# Patient Record
Sex: Female | Born: 1960 | Race: Black or African American | Hispanic: No | Marital: Single
Health system: Southern US, Community
[De-identification: ages and names within clinical notes are randomized; demographics above are authoritative.]

---

## 2017-03-24 ENCOUNTER — Inpatient Hospital Stay
Admission: AD | Admit: 2017-03-24 | Discharge: 2017-04-22 | Disposition: A | Discharge status: Skilled Nursing Facility | Payer: Medicaid Other | Source: Ambulatory Visit | Attending: Internal Medicine | Admitting: Internal Medicine

## 2017-03-24 ENCOUNTER — Other Ambulatory Visit (HOSPITAL_COMMUNITY): Payer: Medicaid Other

## 2017-03-24 DIAGNOSIS — Z4659 Encounter for fitting and adjustment of other gastrointestinal appliance and device: Secondary | ICD-10-CM

## 2017-03-24 DIAGNOSIS — J189 Pneumonia, unspecified organism: Secondary | ICD-10-CM

## 2017-03-24 DIAGNOSIS — J9690 Respiratory failure, unspecified, unspecified whether with hypoxia or hypercapnia: Secondary | ICD-10-CM

## 2017-03-24 DIAGNOSIS — Z431 Encounter for attention to gastrostomy: Secondary | ICD-10-CM

## 2017-03-25 ENCOUNTER — Other Ambulatory Visit (HOSPITAL_COMMUNITY): Payer: Medicaid Other

## 2017-03-25 LAB — HEMOGLOBIN A1C
HEMOGLOBIN A1C: 6.4 % — AB (ref 4.8–5.6)
Mean Plasma Glucose: 136.98 mg/dL

## 2017-03-25 LAB — CBC WITH DIFFERENTIAL/PLATELET
BASOS ABS: 0 10*3/uL (ref 0.0–0.1)
Basophils Relative: 0 %
Eosinophils Absolute: 0.2 10*3/uL (ref 0.0–0.7)
Eosinophils Relative: 2 %
HCT: 32.8 % — ABNORMAL LOW (ref 36.0–46.0)
Hemoglobin: 10.3 g/dL — ABNORMAL LOW (ref 12.0–15.0)
LYMPHS ABS: 1.6 10*3/uL (ref 0.7–4.0)
LYMPHS PCT: 17 %
MCH: 28.6 pg (ref 26.0–34.0)
MCHC: 31.4 g/dL (ref 30.0–36.0)
MCV: 91.1 fL (ref 78.0–100.0)
MONO ABS: 0.9 10*3/uL (ref 0.1–1.0)
Monocytes Relative: 9 %
NEUTROS ABS: 6.9 10*3/uL (ref 1.7–7.7)
Neutrophils Relative %: 72 %
PLATELETS: 290 10*3/uL (ref 150–400)
RBC: 3.6 MIL/uL — ABNORMAL LOW (ref 3.87–5.11)
RDW: 15.5 % (ref 11.5–15.5)
WBC: 9.6 10*3/uL (ref 4.0–10.5)

## 2017-03-25 LAB — COMPREHENSIVE METABOLIC PANEL
ALK PHOS: 44 U/L (ref 38–126)
ALT: 45 U/L (ref 14–54)
ANION GAP: 8 (ref 5–15)
AST: 24 U/L (ref 15–41)
Albumin: 2.5 g/dL — ABNORMAL LOW (ref 3.5–5.0)
BILIRUBIN TOTAL: 0.6 mg/dL (ref 0.3–1.2)
BUN: 7 mg/dL (ref 6–20)
CALCIUM: 9.1 mg/dL (ref 8.9–10.3)
CO2: 26 mmol/L (ref 22–32)
Chloride: 101 mmol/L (ref 101–111)
Creatinine, Ser: 0.57 mg/dL (ref 0.44–1.00)
GFR calc non Af Amer: >60 mL/min (ref >60)
Glucose, Bld: 158 mg/dL — ABNORMAL HIGH (ref 65–99)
Potassium: 4 mmol/L (ref 3.5–5.1)
SODIUM: 135 mmol/L (ref 135–145)
TOTAL PROTEIN: 6.3 g/dL — AB (ref 6.5–8.1)

## 2017-03-25 LAB — URINALYSIS, ROUTINE W REFLEX MICROSCOPIC
Bilirubin Urine: NEGATIVE
Glucose, UA: NEGATIVE mg/dL
Ketones, ur: NEGATIVE mg/dL
Nitrite: NEGATIVE
PH: 6 (ref 5.0–8.0)
Protein, ur: NEGATIVE mg/dL
SPECIFIC GRAVITY, URINE: 1.015 (ref 1.005–1.030)

## 2017-03-25 LAB — TSH: TSH: 3.604 u[IU]/mL (ref 0.350–4.500)

## 2017-03-25 LAB — PROTIME-INR
INR: 1.17
Prothrombin Time: 14.8 seconds (ref 11.4–15.2)

## 2017-03-25 LAB — PHOSPHORUS: PHOSPHORUS: 2.8 mg/dL (ref 2.5–4.6)

## 2017-03-25 LAB — MAGNESIUM: MAGNESIUM: 1.6 mg/dL — AB (ref 1.7–2.4)

## 2017-03-26 LAB — CBC
HEMATOCRIT: 34.8 % — AB (ref 36.0–46.0)
Hemoglobin: 11.3 g/dL — ABNORMAL LOW (ref 12.0–15.0)
MCH: 29.6 pg (ref 26.0–34.0)
MCHC: 32.5 g/dL (ref 30.0–36.0)
MCV: 91.1 fL (ref 78.0–100.0)
PLATELETS: 333 10*3/uL (ref 150–400)
RBC: 3.82 MIL/uL — ABNORMAL LOW (ref 3.87–5.11)
RDW: 15.4 % (ref 11.5–15.5)
WBC: 11.3 10*3/uL — ABNORMAL HIGH (ref 4.0–10.5)

## 2017-03-26 LAB — BASIC METABOLIC PANEL
Anion gap: 9 (ref 5–15)
BUN: 8 mg/dL (ref 6–20)
CO2: 27 mmol/L (ref 22–32)
CREATININE: 0.67 mg/dL (ref 0.44–1.00)
Calcium: 9.4 mg/dL (ref 8.9–10.3)
Chloride: 96 mmol/L — ABNORMAL LOW (ref 101–111)
GFR calc Af Amer: >60 mL/min (ref >60)
GLUCOSE: 301 mg/dL — AB (ref 65–99)
Potassium: 4.1 mmol/L (ref 3.5–5.1)
Sodium: 132 mmol/L — ABNORMAL LOW (ref 135–145)

## 2017-03-26 LAB — URINE CULTURE: Culture: NO GROWTH

## 2017-03-27 LAB — CBC
HCT: 34.5 % — ABNORMAL LOW (ref 36.0–46.0)
HEMOGLOBIN: 11.2 g/dL — AB (ref 12.0–15.0)
MCH: 29.7 pg (ref 26.0–34.0)
MCHC: 32.5 g/dL (ref 30.0–36.0)
MCV: 91.5 fL (ref 78.0–100.0)
PLATELETS: 329 10*3/uL (ref 150–400)
RBC: 3.77 MIL/uL — AB (ref 3.87–5.11)
RDW: 15.3 % (ref 11.5–15.5)
WBC: 13.6 10*3/uL — ABNORMAL HIGH (ref 4.0–10.5)

## 2017-03-27 LAB — OCCULT BLOOD X 1 CARD TO LAB, STOOL: FECAL OCCULT BLD: POSITIVE — AB

## 2017-03-28 LAB — BASIC METABOLIC PANEL
ANION GAP: 7 (ref 5–15)
BUN: 14 mg/dL (ref 6–20)
CHLORIDE: 100 mmol/L — AB (ref 101–111)
CO2: 27 mmol/L (ref 22–32)
Calcium: 9.3 mg/dL (ref 8.9–10.3)
Creatinine, Ser: 0.6 mg/dL (ref 0.44–1.00)
GFR calc Af Amer: >60 mL/min (ref >60)
GLUCOSE: 297 mg/dL — AB (ref 65–99)
POTASSIUM: 4.2 mmol/L (ref 3.5–5.1)
Sodium: 134 mmol/L — ABNORMAL LOW (ref 135–145)

## 2017-03-28 LAB — CBC
HEMATOCRIT: 32.4 % — AB (ref 36.0–46.0)
HEMOGLOBIN: 10.4 g/dL — AB (ref 12.0–15.0)
MCH: 29.2 pg (ref 26.0–34.0)
MCHC: 32.1 g/dL (ref 30.0–36.0)
MCV: 91 fL (ref 78.0–100.0)
PLATELETS: 317 10*3/uL (ref 150–400)
RBC: 3.56 MIL/uL — AB (ref 3.87–5.11)
RDW: 15.1 % (ref 11.5–15.5)
WBC: 12.5 10*3/uL — AB (ref 4.0–10.5)

## 2017-03-28 LAB — MAGNESIUM: MAGNESIUM: 1.7 mg/dL (ref 1.7–2.4)

## 2017-03-28 LAB — PHOSPHORUS: Phosphorus: 3 mg/dL (ref 2.5–4.6)

## 2017-03-29 LAB — CBC
HCT: 33.4 % — ABNORMAL LOW (ref 36.0–46.0)
Hemoglobin: 10.8 g/dL — ABNORMAL LOW (ref 12.0–15.0)
MCH: 29.6 pg (ref 26.0–34.0)
MCHC: 32.3 g/dL (ref 30.0–36.0)
MCV: 91.5 fL (ref 78.0–100.0)
PLATELETS: 328 10*3/uL (ref 150–400)
RBC: 3.65 MIL/uL — AB (ref 3.87–5.11)
RDW: 15.2 % (ref 11.5–15.5)
WBC: 10.6 10*3/uL — ABNORMAL HIGH (ref 4.0–10.5)

## 2017-03-29 LAB — CULTURE, RESPIRATORY: CULTURE: NORMAL

## 2017-03-29 LAB — CULTURE, RESPIRATORY W GRAM STAIN

## 2017-03-31 LAB — BASIC METABOLIC PANEL
Anion gap: 11 (ref 5–15)
BUN: 19 mg/dL (ref 6–20)
CHLORIDE: 99 mmol/L — AB (ref 101–111)
CO2: 25 mmol/L (ref 22–32)
CREATININE: 0.71 mg/dL (ref 0.44–1.00)
Calcium: 9.3 mg/dL (ref 8.9–10.3)
GFR calc non Af Amer: >60 mL/min (ref >60)
GLUCOSE: 260 mg/dL — AB (ref 65–99)
Potassium: 4.2 mmol/L (ref 3.5–5.1)
Sodium: 135 mmol/L (ref 135–145)

## 2017-03-31 LAB — CBC
HCT: 38.5 % (ref 36.0–46.0)
HEMOGLOBIN: 12.1 g/dL (ref 12.0–15.0)
MCH: 29.1 pg (ref 26.0–34.0)
MCHC: 31.4 g/dL (ref 30.0–36.0)
MCV: 92.5 fL (ref 78.0–100.0)
Platelets: 260 10*3/uL (ref 150–400)
RBC: 4.16 MIL/uL (ref 3.87–5.11)
RDW: 15.6 % — ABNORMAL HIGH (ref 11.5–15.5)
WBC: 8.9 10*3/uL (ref 4.0–10.5)

## 2017-04-02 LAB — BASIC METABOLIC PANEL
ANION GAP: 6 (ref 5–15)
BUN: 13 mg/dL (ref 6–20)
CO2: 27 mmol/L (ref 22–32)
Calcium: 9.2 mg/dL (ref 8.9–10.3)
Chloride: 104 mmol/L (ref 101–111)
Creatinine, Ser: 0.7 mg/dL (ref 0.44–1.00)
GLUCOSE: 275 mg/dL — AB (ref 65–99)
POTASSIUM: 3.9 mmol/L (ref 3.5–5.1)
Sodium: 137 mmol/L (ref 135–145)

## 2017-04-02 LAB — CBC
HEMATOCRIT: 33.3 % — AB (ref 36.0–46.0)
Hemoglobin: 10.6 g/dL — ABNORMAL LOW (ref 12.0–15.0)
MCH: 29.2 pg (ref 26.0–34.0)
MCHC: 31.8 g/dL (ref 30.0–36.0)
MCV: 91.7 fL (ref 78.0–100.0)
PLATELETS: 357 10*3/uL (ref 150–400)
RBC: 3.63 MIL/uL — AB (ref 3.87–5.11)
RDW: 15.5 % (ref 11.5–15.5)
WBC: 8.2 10*3/uL (ref 4.0–10.5)

## 2017-04-03 LAB — CULTURE, RESPIRATORY

## 2017-04-03 LAB — CULTURE, RESPIRATORY W GRAM STAIN

## 2017-04-05 ENCOUNTER — Other Ambulatory Visit (HOSPITAL_COMMUNITY): Payer: Medicaid Other

## 2017-04-05 LAB — BLOOD GAS, ARTERIAL
Acid-Base Excess: 5.2 mmol/L — ABNORMAL HIGH (ref 0.0–2.0)
BICARBONATE: 28.8 mmol/L — AB (ref 20.0–28.0)
DRAWN BY: 244851
FIO2: 70
MECHVT: 430 mL
O2 Saturation: 93 %
PEEP: 5 cmH2O
PO2 ART: 66.2 mmHg — AB (ref 83.0–108.0)
Patient temperature: 98.6
RATE: 14 resp/min
pCO2 arterial: 39.6 mmHg (ref 32.0–48.0)
pH, Arterial: 7.475 — ABNORMAL HIGH (ref 7.350–7.450)

## 2017-04-06 LAB — BASIC METABOLIC PANEL
Anion gap: 7 (ref 5–15)
BUN: 15 mg/dL (ref 6–20)
CALCIUM: 9.3 mg/dL (ref 8.9–10.3)
CHLORIDE: 100 mmol/L — AB (ref 101–111)
CO2: 30 mmol/L (ref 22–32)
Creatinine, Ser: 0.71 mg/dL (ref 0.44–1.00)
GFR calc Af Amer: >60 mL/min (ref >60)
GFR calc non Af Amer: >60 mL/min (ref >60)
Glucose, Bld: 248 mg/dL — ABNORMAL HIGH (ref 65–99)
Potassium: 3.9 mmol/L (ref 3.5–5.1)
SODIUM: 137 mmol/L (ref 135–145)

## 2017-04-06 LAB — CBC WITH DIFFERENTIAL/PLATELET
Basophils Absolute: 0 10*3/uL (ref 0.0–0.1)
Basophils Relative: 0 %
EOS ABS: 0.2 10*3/uL (ref 0.0–0.7)
Eosinophils Relative: 3 %
HCT: 34.5 % — ABNORMAL LOW (ref 36.0–46.0)
HEMOGLOBIN: 11 g/dL — AB (ref 12.0–15.0)
LYMPHS ABS: 1.9 10*3/uL (ref 0.7–4.0)
Lymphocytes Relative: 23 %
MCH: 29.1 pg (ref 26.0–34.0)
MCHC: 31.9 g/dL (ref 30.0–36.0)
MCV: 91.3 fL (ref 78.0–100.0)
MONO ABS: 1 10*3/uL (ref 0.1–1.0)
MONOS PCT: 13 %
Neutro Abs: 5.1 10*3/uL (ref 1.7–7.7)
Neutrophils Relative %: 61 %
PLATELETS: 319 10*3/uL (ref 150–400)
RBC: 3.78 MIL/uL — ABNORMAL LOW (ref 3.87–5.11)
RDW: 15.7 % — AB (ref 11.5–15.5)
WBC: 8.2 10*3/uL (ref 4.0–10.5)

## 2017-04-06 LAB — PHOSPHORUS: Phosphorus: 3.5 mg/dL (ref 2.5–4.6)

## 2017-04-06 LAB — HEMOGLOBIN A1C
Hgb A1c MFr Bld: 7.4 % — ABNORMAL HIGH (ref 4.8–5.6)
Mean Plasma Glucose: 165.68 mg/dL

## 2017-04-06 LAB — MAGNESIUM: MAGNESIUM: 1.7 mg/dL (ref 1.7–2.4)

## 2017-04-06 NOTE — Consult Note (Signed)
Chief Complaint: Patient was seen in consultation today for dysphagia  Referring Physician(s):  Dr. Sharyon Medicus  History of Present Illness: Nancy Rogers is a 56 y.o. female with past medical history of respiratory failure requiring vent-trach dependence, now with ongoing difficulty in meeting nutrition needs.  Patient currently with NGT in place.   CT Abd obtained 04/05/17 shows: Stomach anatomy is amenable to fluoroscopic gastrostomy insertion. Stomach is anteriorly positioned and superior to the transverse colon. No associated hiatal hernia. Negative for bowel obstruction, significant ileus, fluid collection or abscess Abdominal atherosclerosis Bibasilar atelectasis / consolidation worse on the right  No past medical history on file.  No past surgical history on file.  Allergies: Patient has no allergy information on record.  Medications: Prior to Admission medications   Not on File     No family history on file.  Social History   Social History  . Marital status: Single    Spouse name: N/A  . Number of children: N/A  . Years of education: N/A   Social History Main Topics  . Smoking status: Not on file  . Smokeless tobacco: Not on file  . Alcohol use Not on file  . Drug use: Unknown  . Sexual activity: Not on file   Other Topics Concern  . Not on file   Social History Narrative  . No narrative on file    Review of Systems  Unable to perform ROS: Intubated    Vital Signs: There were no vitals taken for this visit.  Physical Exam  Constitutional: She appears well-developed.  Cardiovascular: Normal rate, regular rhythm and normal heart sounds.   Pulmonary/Chest: Effort normal.  Trach in place, coarse breath sounds  Abdominal: Soft.  Skin: Skin is warm and dry.  Nursing note reviewed.   Mallampati Score:  MD Evaluation Airway: Other (comments) Airway comments: trach vent Heart: WNL Abdomen: WNL Chest/ Lungs: WNL ASA  Classification:  3 Mallampati/Airway Score: Three  Imaging: Ct Abdomen Wo Contrast  Result Date: 04/05/2017 CLINICAL DATA:  malnutrition, assess for gastrostomy placement EXAM: CT ABDOMEN WITHOUT CONTRAST TECHNIQUE: Multidetector CT imaging of the abdomen was performed following the standard protocol without IV contrast. COMPARISON:  None available FINDINGS: Lower chest: Mild bibasilar partial collapse / consolidation worse on the right. No pericardial pleural effusion. Normal heart size. Hepatobiliary: No focal liver abnormality is seen. No gallstones, gallbladder wall thickening, or biliary dilatation. Pancreas: Unremarkable. No pancreatic ductal dilatation or surrounding inflammatory changes. Spleen: Normal in size without focal abnormality. Adrenals/Urinary Tract: Adrenal glands are unremarkable. Kidneys are normal, without renal calculi, focal lesion, or hydronephrosis. Stomach/Bowel: No significant hiatal hernia. Stomach is normally positioned and amenable to fluoroscopic gastrostomy insertion. NG tube tip extends to the distal stomach within the duodenal bulb. Transverse colon is inferior to the stomach. Negative for bowel obstruction, significant dilatation, ileus, or free air. No fluid collection or abscess. Vascular/Lymphatic: Atherosclerosis of the aorta. Negative for aneurysm. No retroperitoneal abnormality or hemorrhage. No adenopathy. Other: Image portion of the uterus demonstrates mild enlargement with degenerated calcified fibroids. This extends into the lower abdomen. Pelvis was not imaged. Musculoskeletal: Degenerative changes noted spine. No acute osseous finding or compression fracture. IMPRESSION: Stomach anatomy is amenable to fluoroscopic gastrostomy insertion. Stomach is anteriorly positioned and superior to the transverse colon. No associated hiatal hernia. Negative for bowel obstruction, significant ileus, fluid collection or abscess Abdominal atherosclerosis Bibasilar atelectasis / consolidation  worse on the right Electronically Signed   By: Judie Petit.  Shick M.D.  On: 04/05/2017 20:00   Dg Chest Port 1 View  Result Date: 04/05/2017 CLINICAL DATA:  Respiratory failure EXAM: PORTABLE CHEST 1 VIEW COMPARISON:  January 21, 2017 FINDINGS: Tracheostomy catheter tip is 3.6 cm above the carina. Nasogastric tube tip and side port are below the diaphragm. No pneumothorax. There is atelectatic change in the medial left base. Lungs elsewhere clear. Heart size and pulmonary vascularity are normal. No adenopathy. No evident bone lesions. IMPRESSION: Tube positions as described without pneumothorax. Atelectasis medial left base. Lungs elsewhere clear. Stable cardiac silhouette. Electronically Signed   By: Bretta Bang III M.D.   On: 04/05/2017 08:33   Dg Chest Port 1 View  Result Date: 03/24/2017 CLINICAL DATA:  Respiratory failure. Patient has undergone tracheostomy tube placement and nasogastric tube placement. Assess positioning. EXAM: PORTABLE CHEST 1 VIEW COMPARISON:  None in PACs FINDINGS: The lung volumes are low. There is is increased density in the medial retrocardiac region bilaterally. The tracheostomy tube tip projects approximately 1 cm above the clavicular heads. The esophagogastric tube tip and proximal port lie in the mid to distal gastric body. The heart is top-normal in size. The central pulmonary vascularity is prominent but some crowding due to hypoinflation is suspected. There The bony structures exhibit no acute abnormality. IMPRESSION: Bilateral hypoinflation. Probable bibasilar atelectasis or pneumonia. Mild enlargement of cardiac silhouette without significant pulmonary vascular congestion. Positioning of the esophagogastric tube and the tracheostomy tube is within the limits of normal. Electronically Signed   By: David  [place] M.D.   On: 03/24/2017 16:23   Dg Abd Portable 1v  Result Date: 03/25/2017 CLINICAL DATA:  Encounter for NG tube placement. EXAM: PORTABLE ABDOMEN - 1 VIEW COMPARISON:   None. FINDINGS: Nasogastric tube appears well positioned in the stomach with tip directed towards the stomach pylorus/duodenal bulb. Visualized bowel gas pattern is nonobstructive. No evidence of free intraperitoneal air seen. IMPRESSION: Nasogastric tube now appears well positioned in the stomach with tip directed towards the stomach pylorus/duodenal bulb. Electronically Signed   By: Bary Richard M.D.   On: 03/25/2017 18:34   Dg Abd Portable 1v  Result Date: 03/25/2017 CLINICAL DATA:  NG tube placement. EXAM: PORTABLE ABDOMEN - 1 VIEW COMPARISON:  None. FINDINGS: Enteric tube is not visible on this plain film of the abdomen. Visualized bowel gas pattern is nonobstructive. No evidence of soft tissue mass or abnormal fluid collection. IMPRESSION: Nasogastric tube is not visible on this exam. Electronically Signed   By: Bary Richard M.D.   On: 03/25/2017 17:59   Dg Abd Portable 1v  Result Date: 03/24/2017 CLINICAL DATA:  NG placement EXAM: PORTABLE ABDOMEN - 1 VIEW COMPARISON:  None. FINDINGS: Enteric tube tip is localized to the right upper quadrant consistent with location in the distal stomach. Residual contrast material throughout the colon. No small or large bowel distention. Calcification in the pelvis likely represents a calcified fibroid. IMPRESSION: Enteric tube tip is in the right upper quadrant consistent with location in the distal stomach. Electronically Signed   By: Burman Nieves M.D.   On: 03/24/2017 23:27    Labs:  CBC:  Recent Labs  03/29/17 1050 03/31/17 0622 04/02/17 0735 04/06/17 0606  WBC 10.6* 8.9 8.2 8.2  HGB 10.8* 12.1 10.6* 11.0*  HCT 33.4* 38.5 33.3* 34.5*  PLT 328 260 357 319    COAGS:  Recent Labs  03/25/17 0836  INR 1.17    BMP:  Recent Labs  03/28/17 0449 03/31/17 0622 04/02/17 0735 04/06/17 0606  NA 134*  135 137 137  K 4.2 4.2 3.9 3.9  CL 100* 99* 104 100*  CO2 GLUCOSE 297* 260* 275* 248*  BUN CALCIUM 9.3 9.3  9.2 9.3  CREATININE 0.60 0.71 0.70 0.71  GFRNONAA >60 >60 >60 >60  GFRAA >60 >60 >60 >60    LIVER FUNCTION TESTS:  Recent Labs  03/25/17 0836  BILITOT 0.6  AST 24  ALT 45  ALKPHOS 44  PROT 6.3*  ALBUMIN 2.5*    TUMOR MARKERS: No results for input(s): AFPTM, CEA, CA199, CHROMGRNA in the last 8760 hours.  Assessment and Plan: Patient with vent-dependent respiratory failure requiring long-term care.  Request is made for gastrostomy placement by Dr. Sharyon Medicus.  Anatomy reviewed by Dr. Miles Costain who approves patient for procedure in IR.  Patient currently with NGT in place.  Orders placed for hold TF after midnight as well as blood thinners for possible placement.  Discussed procedure with mother.  Risks and benefits discussed with the patient including, but not limited to the need for a barium enema during the procedure, bleeding, infection, peritonitis, or damage to adjacent structures. All of the patient's questions were answered, patient is agreeable to proceed. Consent signed and in chart.  Thank you for this interesting consult.  I greatly enjoyed meeting Alaina R Majerus and look forward to participating in their care.  A copy of this report was sent to the requesting provider on this date.  Electronically Signed: Hoyt Koch 04/06/2017, 3:57 PM   I spent a total of 40 Minutes    in face to face in clinical consultation, greater than 50% of which was counseling/coordinating care for long-term care.

## 2017-04-08 ENCOUNTER — Encounter (HOSPITAL_COMMUNITY): Payer: Self-pay | Admitting: Interventional Radiology

## 2017-04-08 ENCOUNTER — Other Ambulatory Visit (HOSPITAL_COMMUNITY): Payer: Medicaid Other

## 2017-04-08 HISTORY — PX: IR GASTROSTOMY TUBE MOD SED: IMG625

## 2017-04-08 MED ORDER — MIDAZOLAM HCL 2 MG/2ML IJ SOLN
INTRAMUSCULAR | Status: AC | PRN
  Administered 2017-04-08 (×2): 1 mg via INTRAVENOUS

## 2017-04-08 MED ORDER — LIDOCAINE HCL (PF) 1 % IJ SOLN
INTRAMUSCULAR | Status: AC
  Filled 2017-04-08: qty 30

## 2017-04-08 MED ORDER — VANCOMYCIN HCL IN DEXTROSE 1-5 GM/200ML-% IV SOLN
1000.0000 mg | Freq: Once | INTRAVENOUS | Status: AC
Start: 2017-04-08 — End: 2017-04-08
  Administered 2017-04-08: 1000 mg via INTRAVENOUS

## 2017-04-08 MED ORDER — MIDAZOLAM HCL 2 MG/2ML IJ SOLN
INTRAMUSCULAR | Status: AC
  Filled 2017-04-08: qty 4

## 2017-04-08 MED ORDER — FENTANYL CITRATE (PF) 100 MCG/2ML IJ SOLN
INTRAMUSCULAR | Status: AC | PRN
  Administered 2017-04-08: 25 ug via INTRAVENOUS

## 2017-04-08 MED ORDER — VANCOMYCIN HCL IN DEXTROSE 1-5 GM/200ML-% IV SOLN
INTRAVENOUS | Status: AC
  Filled 2017-04-08: qty 200

## 2017-04-08 MED ORDER — FENTANYL CITRATE (PF) 100 MCG/2ML IJ SOLN
INTRAMUSCULAR | Status: AC
  Filled 2017-04-08: qty 4

## 2017-04-08 MED ORDER — IOPAMIDOL (ISOVUE-300) INJECTION 61%
INTRAVENOUS | Status: AC
  Administered 2017-04-08: 10 mL
  Filled 2017-04-08: qty 50

## 2017-04-08 MED ORDER — LIDOCAINE HCL (PF) 1 % IJ SOLN
INTRAMUSCULAR | Status: AC | PRN
  Administered 2017-04-08 (×2): 5 mL

## 2017-04-08 NOTE — Procedures (Signed)
Interventional Radiology Procedure Note  Procedure: Percutaneous gastrostomy tube placement  Complications: None  Estimated Blood Loss: < 10 mL  20 Fr bumper retention gastrostomy tube placed with tip in body of stomach.  OK to use in 24 hours.  Jodi Marble. Fredia Sorrow, M.D Pager:  [phone]

## 2017-04-09 LAB — CBC
HEMATOCRIT: 26.6 % — AB (ref 36.0–46.0)
HEMOGLOBIN: 8.6 g/dL — AB (ref 12.0–15.0)
MCH: 29.2 pg (ref 26.0–34.0)
MCHC: 32.3 g/dL (ref 30.0–36.0)
MCV: 90.2 fL (ref 78.0–100.0)
Platelets: 266 10*3/uL (ref 150–400)
RBC: 2.95 MIL/uL — ABNORMAL LOW (ref 3.87–5.11)
RDW: 15.2 % (ref 11.5–15.5)
WBC: 12.1 10*3/uL — AB (ref 4.0–10.5)

## 2017-04-09 LAB — BASIC METABOLIC PANEL
ANION GAP: 7 (ref 5–15)
BUN: 13 mg/dL (ref 6–20)
CO2: 27 mmol/L (ref 22–32)
CREATININE: 0.73 mg/dL (ref 0.44–1.00)
Calcium: 8.6 mg/dL — ABNORMAL LOW (ref 8.9–10.3)
Chloride: 96 mmol/L — ABNORMAL LOW (ref 101–111)
Glucose, Bld: 231 mg/dL — ABNORMAL HIGH (ref 65–99)
Potassium: 3.5 mmol/L (ref 3.5–5.1)
SODIUM: 130 mmol/L — AB (ref 135–145)

## 2017-04-10 LAB — CULTURE, RESPIRATORY

## 2017-04-10 LAB — CULTURE, RESPIRATORY W GRAM STAIN

## 2017-04-13 LAB — CBC
HCT: 32.7 % — ABNORMAL LOW (ref 36.0–46.0)
HEMOGLOBIN: 10.4 g/dL — AB (ref 12.0–15.0)
MCH: 28.4 pg (ref 26.0–34.0)
MCHC: 31.8 g/dL (ref 30.0–36.0)
MCV: 89.3 fL (ref 78.0–100.0)
PLATELETS: 294 10*3/uL (ref 150–400)
RBC: 3.66 MIL/uL — AB (ref 3.87–5.11)
RDW: 15.3 % (ref 11.5–15.5)
WBC: 9.6 10*3/uL (ref 4.0–10.5)

## 2017-04-13 LAB — BASIC METABOLIC PANEL
ANION GAP: 8 (ref 5–15)
BUN: 8 mg/dL (ref 6–20)
CHLORIDE: 98 mmol/L — AB (ref 101–111)
CO2: 27 mmol/L (ref 22–32)
Calcium: 9.1 mg/dL (ref 8.9–10.3)
Creatinine, Ser: 0.57 mg/dL (ref 0.44–1.00)
Glucose, Bld: 221 mg/dL — ABNORMAL HIGH (ref 65–99)
POTASSIUM: 3.7 mmol/L (ref 3.5–5.1)
SODIUM: 133 mmol/L — AB (ref 135–145)

## 2017-04-13 LAB — BLOOD GAS, ARTERIAL
ACID-BASE EXCESS: 4.9 mmol/L — AB (ref 0.0–2.0)
Bicarbonate: 28.3 mmol/L — ABNORMAL HIGH (ref 20.0–28.0)
FIO2: 50
O2 SAT: 89 %
PATIENT TEMPERATURE: 98.6
PCO2 ART: 37.9 mmHg (ref 32.0–48.0)
PO2 ART: 56.3 mmHg — AB (ref 83.0–108.0)
pH, Arterial: 7.486 — ABNORMAL HIGH (ref 7.350–7.450)

## 2017-04-14 LAB — BLOOD GAS, ARTERIAL
ACID-BASE EXCESS: 3.6 mmol/L — AB (ref 0.0–2.0)
Bicarbonate: 27 mmol/L (ref 20.0–28.0)
FIO2: 50
O2 SAT: 91.7 %
PCO2 ART: 35.7 mmHg (ref 32.0–48.0)
PH ART: 7.489 — AB (ref 7.350–7.450)
PO2 ART: 59.7 mmHg — AB (ref 83.0–108.0)
Patient temperature: 97.6

## 2017-04-17 LAB — BASIC METABOLIC PANEL
Anion gap: 10 (ref 5–15)
BUN: 11 mg/dL (ref 6–20)
CALCIUM: 9.1 mg/dL (ref 8.9–10.3)
CO2: 26 mmol/L (ref 22–32)
CREATININE: 0.66 mg/dL (ref 0.44–1.00)
Chloride: 99 mmol/L — ABNORMAL LOW (ref 101–111)
Glucose, Bld: 124 mg/dL — ABNORMAL HIGH (ref 65–99)
Potassium: 4 mmol/L (ref 3.5–5.1)
SODIUM: 135 mmol/L (ref 135–145)

## 2017-04-17 LAB — CBC
HCT: 33.4 % — ABNORMAL LOW (ref 36.0–46.0)
Hemoglobin: 10.8 g/dL — ABNORMAL LOW (ref 12.0–15.0)
MCH: 28.9 pg (ref 26.0–34.0)
MCHC: 32.3 g/dL (ref 30.0–36.0)
MCV: 89.3 fL (ref 78.0–100.0)
PLATELETS: 374 10*3/uL (ref 150–400)
RBC: 3.74 MIL/uL — ABNORMAL LOW (ref 3.87–5.11)
RDW: 15.5 % (ref 11.5–15.5)
WBC: 13 10*3/uL — ABNORMAL HIGH (ref 4.0–10.5)

## 2017-08-19 DEATH — deceased

## 2019-03-25 IMAGING — CR DG CHEST 1V PORT
1 series · 1 of 1 positions shown · non-contrast
Comparison: None in PACs

CLINICAL DATA: Respiratory failure. Patient has undergone
tracheostomy tube placement and nasogastric tube placement. Assess
positioning.

EXAM:
PORTABLE CHEST 1 VIEW

[AP]
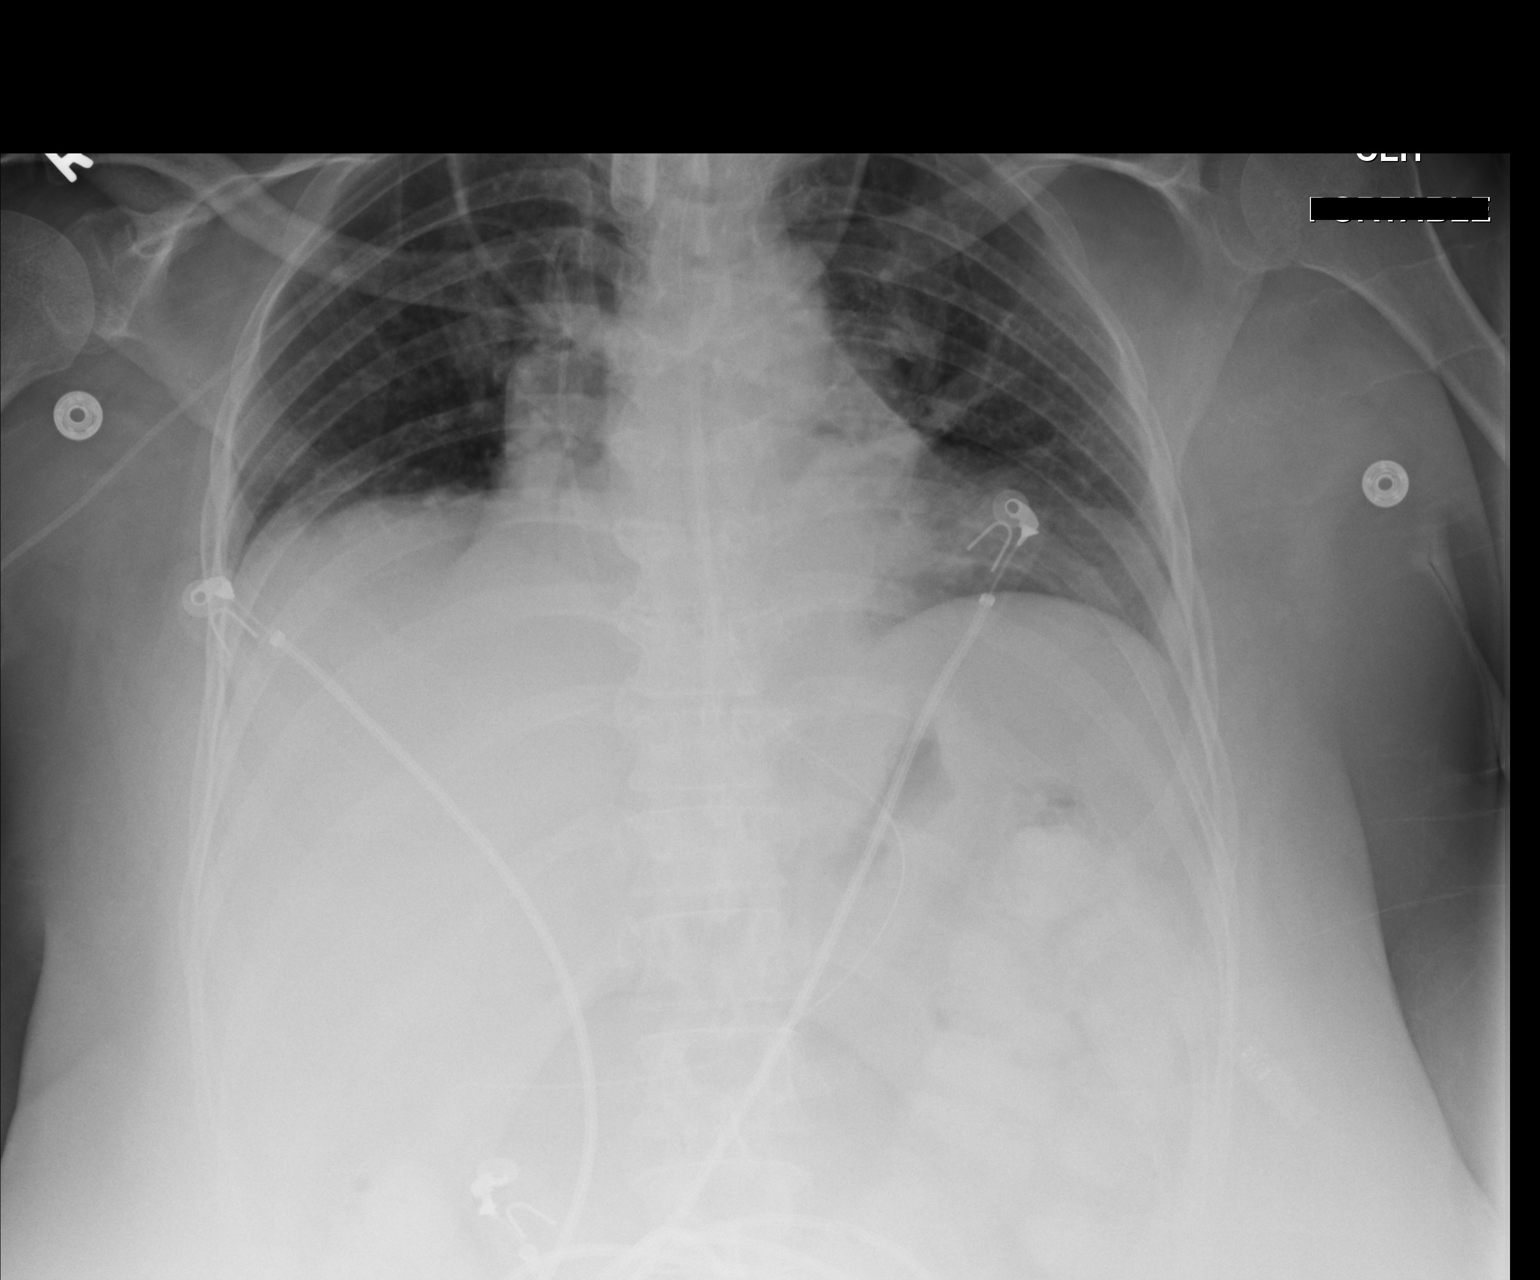

[1 of 1 positions shown; findings below may reference images not displayed]

FINDINGS: The lung volumes are low. There is is increased density in the
medial retrocardiac region bilaterally. The tracheostomy tube tip
projects approximately 1 cm above the clavicular heads. The
esophagogastric tube tip and proximal port lie in the mid to distal
gastric body. The heart is top-normal in size. The central pulmonary
vascularity is prominent but some crowding due to hypoinflation is
suspected. There The bony structures exhibit no acute abnormality.
IMPRESSION: Bilateral hypoinflation. Probable bibasilar atelectasis or
pneumonia. Mild enlargement of cardiac silhouette without
significant pulmonary vascular congestion.

Positioning of the esophagogastric tube and the tracheostomy tube is
within the limits of normal.

## 2019-03-26 IMAGING — DX DG ABD PORTABLE 1V
1 series · 1 of 1 positions shown · non-contrast
Comparison: None.

CLINICAL DATA: Encounter for NG tube placement.

EXAM:
PORTABLE ABDOMEN - 1 VIEW

[abdomen kub]
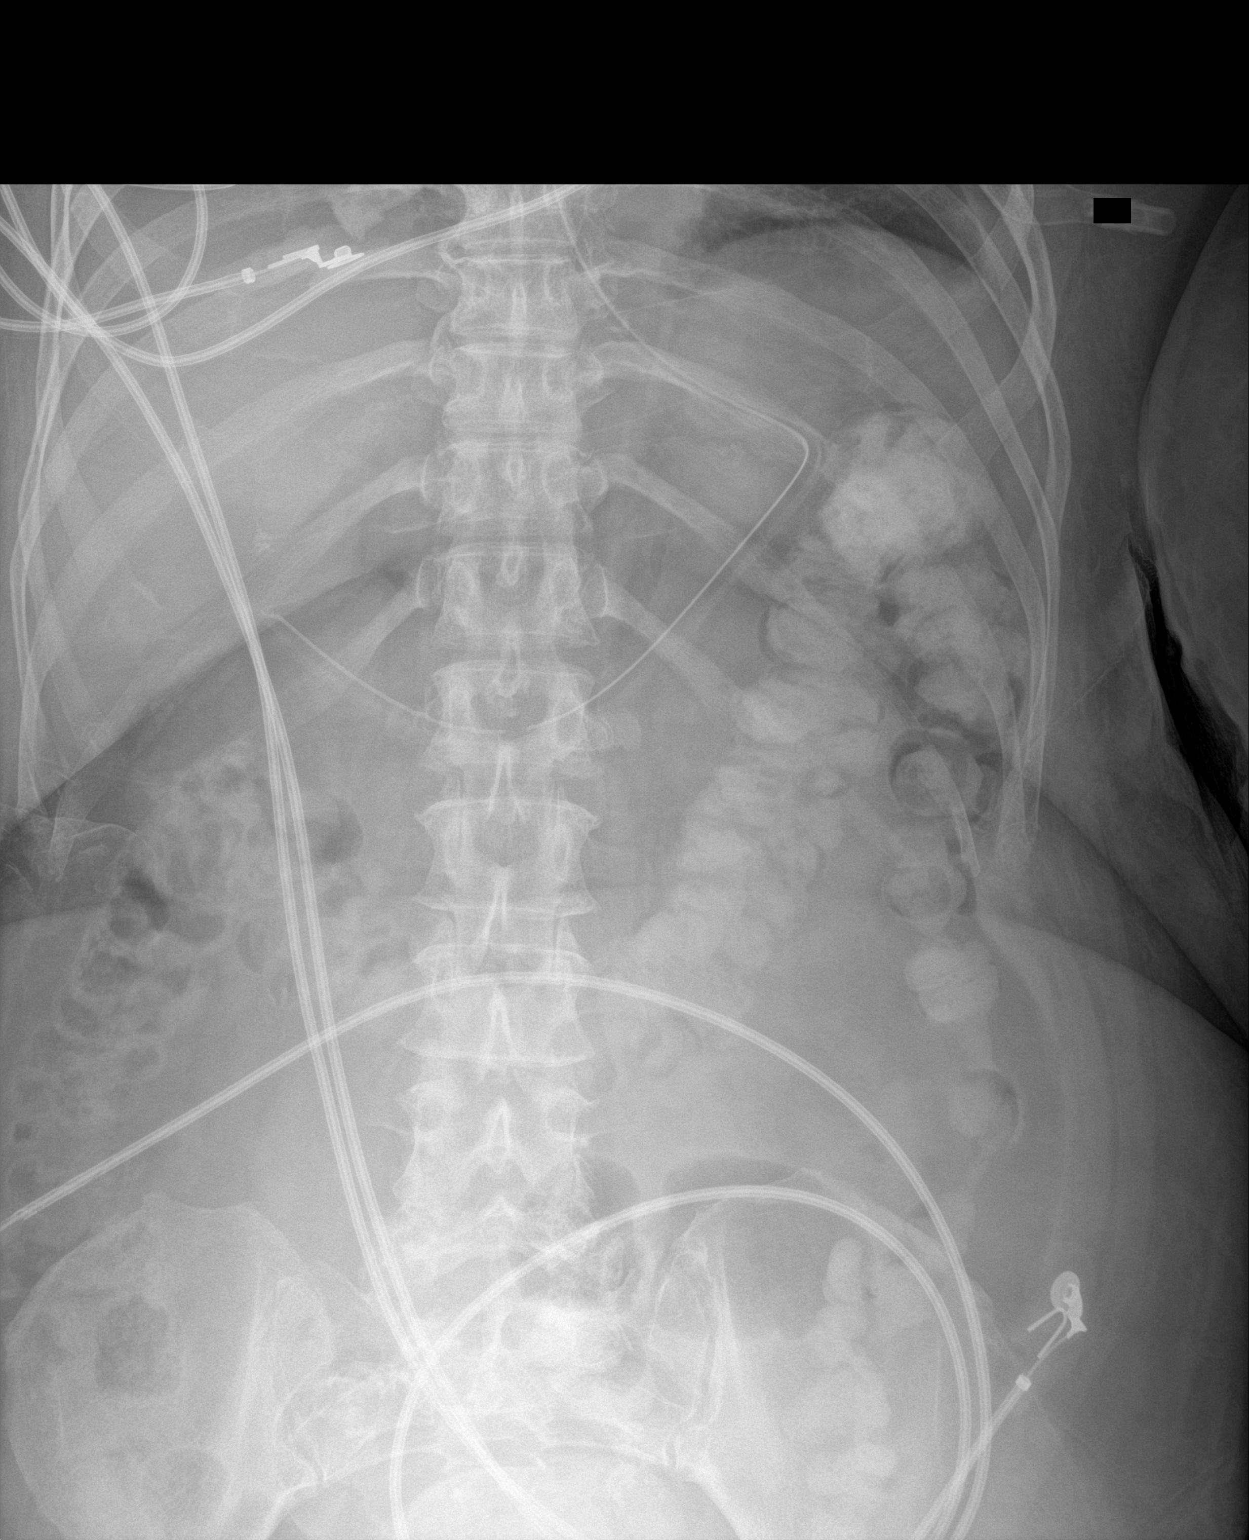

[1 of 1 positions shown; findings below may reference images not displayed]

FINDINGS: Nasogastric tube appears well positioned in the stomach with tip
directed towards the stomach pylorus/duodenal bulb. Visualized bowel
gas pattern is nonobstructive. No evidence of free intraperitoneal
air seen.
IMPRESSION: Nasogastric tube now appears well positioned in the stomach with tip
directed towards the stomach pylorus/duodenal bulb.

## 2019-03-26 IMAGING — DX DG ABD PORTABLE 1V
1 series · 1 of 1 positions shown · non-contrast
Comparison: None.

CLINICAL DATA: NG tube placement.

EXAM:
PORTABLE ABDOMEN - 1 VIEW

[abdomen kub]
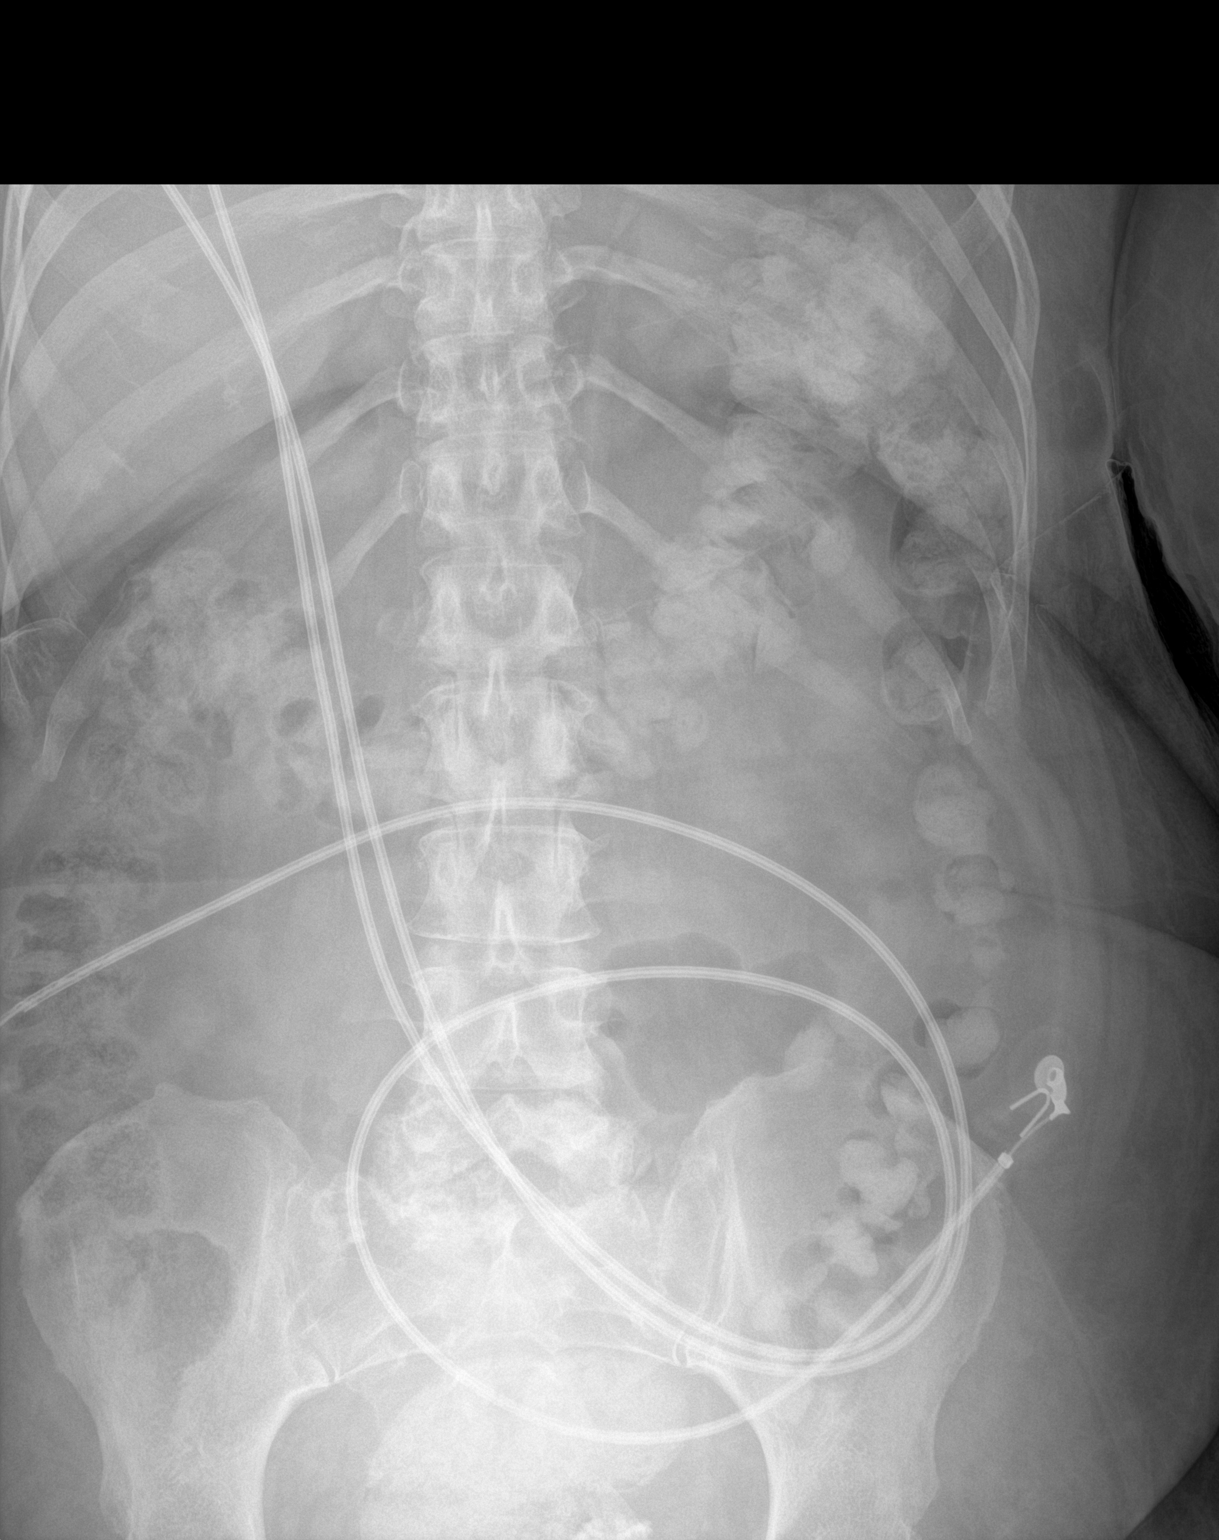

[1 of 1 positions shown; findings below may reference images not displayed]

FINDINGS: Enteric tube is not visible on this plain film of the abdomen.

Visualized bowel gas pattern is nonobstructive. No evidence of soft
tissue mass or abnormal fluid collection.
IMPRESSION: Nasogastric tube is not visible on this exam.
